# Patient Record
Sex: Female | Born: 1935 | Race: White | Hispanic: No | Marital: Married
Health system: Southern US, Community
[De-identification: ages and names within clinical notes are randomized; demographics above are authoritative.]

---

## 2003-05-31 ENCOUNTER — Other Ambulatory Visit: Payer: Self-pay

## 2003-11-29 ENCOUNTER — Encounter: Payer: Self-pay | Admitting: Internal Medicine

## 2003-12-30 ENCOUNTER — Encounter: Payer: Self-pay | Admitting: Internal Medicine

## 2004-01-29 ENCOUNTER — Encounter: Payer: Self-pay | Admitting: Internal Medicine

## 2004-02-29 ENCOUNTER — Encounter: Payer: Self-pay | Admitting: Internal Medicine

## 2004-03-31 ENCOUNTER — Encounter: Payer: Self-pay | Admitting: Internal Medicine

## 2004-04-28 ENCOUNTER — Encounter: Payer: Self-pay | Admitting: Internal Medicine

## 2004-05-29 ENCOUNTER — Encounter: Payer: Self-pay | Admitting: Internal Medicine

## 2004-06-28 ENCOUNTER — Encounter: Payer: Self-pay | Admitting: Internal Medicine

## 2004-07-29 ENCOUNTER — Encounter: Payer: Self-pay | Admitting: Internal Medicine

## 2004-08-28 ENCOUNTER — Encounter: Payer: Self-pay | Admitting: Internal Medicine

## 2004-09-28 ENCOUNTER — Encounter: Payer: Self-pay | Admitting: Internal Medicine

## 2004-10-29 ENCOUNTER — Encounter: Payer: Self-pay | Admitting: Internal Medicine

## 2004-11-28 ENCOUNTER — Encounter: Payer: Self-pay | Admitting: Internal Medicine

## 2004-12-29 ENCOUNTER — Encounter: Payer: Self-pay | Admitting: Internal Medicine

## 2005-01-28 ENCOUNTER — Encounter: Payer: Self-pay | Admitting: Internal Medicine

## 2005-02-28 ENCOUNTER — Encounter: Payer: Self-pay | Admitting: Internal Medicine

## 2005-03-31 ENCOUNTER — Encounter: Payer: Self-pay | Admitting: Internal Medicine

## 2005-04-28 ENCOUNTER — Encounter: Payer: Self-pay | Admitting: Internal Medicine

## 2005-05-29 ENCOUNTER — Encounter: Payer: Self-pay | Admitting: Internal Medicine

## 2005-06-28 ENCOUNTER — Encounter: Payer: Self-pay | Admitting: Internal Medicine

## 2005-07-29 ENCOUNTER — Encounter: Payer: Self-pay | Admitting: Internal Medicine

## 2005-08-28 ENCOUNTER — Encounter: Payer: Self-pay | Admitting: Internal Medicine

## 2005-09-28 ENCOUNTER — Encounter: Payer: Self-pay | Admitting: Internal Medicine

## 2005-10-29 ENCOUNTER — Encounter: Payer: Self-pay | Admitting: Internal Medicine

## 2005-11-28 ENCOUNTER — Encounter: Payer: Self-pay | Admitting: Internal Medicine

## 2005-12-29 ENCOUNTER — Encounter: Payer: Self-pay | Admitting: Internal Medicine

## 2006-01-28 ENCOUNTER — Encounter: Payer: Self-pay | Admitting: Internal Medicine

## 2006-02-28 ENCOUNTER — Encounter: Payer: Self-pay | Admitting: Internal Medicine

## 2006-03-31 ENCOUNTER — Encounter: Payer: Self-pay | Admitting: Internal Medicine

## 2006-04-29 ENCOUNTER — Encounter: Payer: Self-pay | Admitting: Internal Medicine

## 2006-05-30 ENCOUNTER — Encounter: Payer: Self-pay | Admitting: Internal Medicine

## 2006-06-29 ENCOUNTER — Encounter: Payer: Self-pay | Admitting: Internal Medicine

## 2006-07-30 ENCOUNTER — Encounter: Payer: Self-pay | Admitting: Internal Medicine

## 2006-08-30 ENCOUNTER — Encounter: Payer: Self-pay | Admitting: Internal Medicine

## 2006-09-13 ENCOUNTER — Ambulatory Visit: Payer: Self-pay | Admitting: Internal Medicine

## 2006-09-29 ENCOUNTER — Encounter: Payer: Self-pay | Admitting: Internal Medicine

## 2006-10-30 ENCOUNTER — Encounter: Payer: Self-pay | Admitting: Internal Medicine

## 2007-09-18 ENCOUNTER — Ambulatory Visit: Payer: Self-pay | Admitting: Internal Medicine

## 2008-09-02 ENCOUNTER — Ambulatory Visit: Payer: Self-pay | Admitting: Internal Medicine

## 2009-01-09 ENCOUNTER — Ambulatory Visit: Payer: Self-pay | Admitting: Internal Medicine

## 2009-04-10 ENCOUNTER — Ambulatory Visit: Payer: Self-pay | Admitting: Internal Medicine

## 2009-09-04 ENCOUNTER — Emergency Department: Payer: Self-pay | Admitting: Emergency Medicine

## 2010-01-28 ENCOUNTER — Ambulatory Visit: Payer: Self-pay | Admitting: Internal Medicine

## 2010-08-02 ENCOUNTER — Emergency Department: Payer: Self-pay | Admitting: Emergency Medicine

## 2010-09-16 ENCOUNTER — Ambulatory Visit: Payer: Self-pay | Admitting: Internal Medicine

## 2011-08-16 ENCOUNTER — Emergency Department: Payer: Self-pay | Admitting: Internal Medicine

## 2011-08-16 LAB — COMPREHENSIVE METABOLIC PANEL
Albumin: 3.7 g/dL (ref 3.4–5.0)
Alkaline Phosphatase: 93 U/L (ref 50–136)
Anion Gap: 9 (ref 7–16)
BUN: 22 mg/dL — ABNORMAL HIGH (ref 7–18)
Bilirubin,Total: 1.5 mg/dL — ABNORMAL HIGH (ref 0.2–1.0)
Co2: 26 mmol/L (ref 21–32)
Creatinine: 1.39 mg/dL — ABNORMAL HIGH (ref 0.60–1.30)
EGFR (African American): 42 — ABNORMAL LOW
EGFR (Non-African Amer.): 36 — ABNORMAL LOW
Potassium: 3.6 mmol/L (ref 3.5–5.1)
SGOT(AST): 25 U/L (ref 15–37)
Sodium: 143 mmol/L (ref 136–145)
Total Protein: 8.2 g/dL (ref 6.4–8.2)

## 2011-08-16 LAB — CBC
HCT: 35.3 % (ref 35.0–47.0)
HGB: 11.5 g/dL — ABNORMAL LOW (ref 12.0–16.0)
MCH: 27.5 pg (ref 26.0–34.0)
MCV: 85 fL (ref 80–100)
RBC: 4.16 10*6/uL (ref 3.80–5.20)
RDW: 18.1 % — ABNORMAL HIGH (ref 11.5–14.5)

## 2011-08-16 LAB — URINALYSIS, COMPLETE
Bacteria: NONE SEEN
Bilirubin,UR: NEGATIVE
Leukocyte Esterase: NEGATIVE
Ph: 5 (ref 4.5–8.0)
RBC,UR: 9 /HPF (ref 0–5)
Squamous Epithelial: NONE SEEN
WBC UR: 2 /HPF (ref 0–5)

## 2011-08-16 LAB — PROTIME-INR: Prothrombin Time: 23.7 secs — ABNORMAL HIGH (ref 11.5–14.7)

## 2012-01-30 ENCOUNTER — Ambulatory Visit: Payer: Self-pay

## 2012-05-16 ENCOUNTER — Other Ambulatory Visit: Payer: Self-pay | Admitting: Physician Assistant

## 2012-05-21 LAB — CULTURE, BLOOD (SINGLE)

## 2012-12-23 ENCOUNTER — Emergency Department: Payer: Self-pay | Admitting: Emergency Medicine

## 2012-12-23 LAB — URINALYSIS, COMPLETE: Ph: 5 (ref 4.5–8.0)

## 2012-12-23 LAB — COMPREHENSIVE METABOLIC PANEL
Albumin: 3.5 g/dL (ref 3.4–5.0)
Alkaline Phosphatase: 82 U/L (ref 50–136)
Anion Gap: 5 — ABNORMAL LOW (ref 7–16)
BUN: 23 mg/dL — ABNORMAL HIGH (ref 7–18)
Bilirubin,Total: 1 mg/dL (ref 0.2–1.0)
Calcium, Total: 9.4 mg/dL (ref 8.5–10.1)
Chloride: 108 mmol/L — ABNORMAL HIGH (ref 98–107)
Co2: 31 mmol/L (ref 21–32)
Creatinine: 1.57 mg/dL — ABNORMAL HIGH (ref 0.60–1.30)
EGFR (African American): 36 — ABNORMAL LOW
EGFR (Non-African Amer.): 31 — ABNORMAL LOW
Glucose: 90 mg/dL (ref 65–99)
Osmolality: 290 (ref 275–301)
Potassium: 4.4 mmol/L (ref 3.5–5.1)
SGOT(AST): 29 U/L (ref 15–37)
SGPT (ALT): 16 U/L (ref 12–78)
Sodium: 144 mmol/L (ref 136–145)
Total Protein: 8 g/dL (ref 6.4–8.2)

## 2012-12-23 LAB — PROTIME-INR: Prothrombin Time: 24.3 secs — ABNORMAL HIGH (ref 11.5–14.7)

## 2012-12-23 LAB — CBC
HGB: 11.7 g/dL — ABNORMAL LOW (ref 12.0–16.0)
MCH: 27.9 pg (ref 26.0–34.0)
MCHC: 33.6 g/dL (ref 32.0–36.0)
MCV: 83 fL (ref 80–100)
RDW: 18.2 % — ABNORMAL HIGH (ref 11.5–14.5)

## 2012-12-23 LAB — APTT: Activated PTT: 35.5 secs (ref 23.6–35.9)

## 2012-12-25 LAB — URINE CULTURE

## 2012-12-27 IMAGING — CT CT ABD-PELV W/ CM
1 of 4 series · 11 of 32 positions shown, 17 images · non-contrast
Comparison: none

REASON FOR EXAM: Eval cirrhosis kidney and spleen lesions
COMMENTS:

[Series 2: soft tissue · axial · 0.70mm/px · z∈[-287,+70]mm · 11 of 143 slices shown, 17 images]
[im 12/143  soft-tissue]
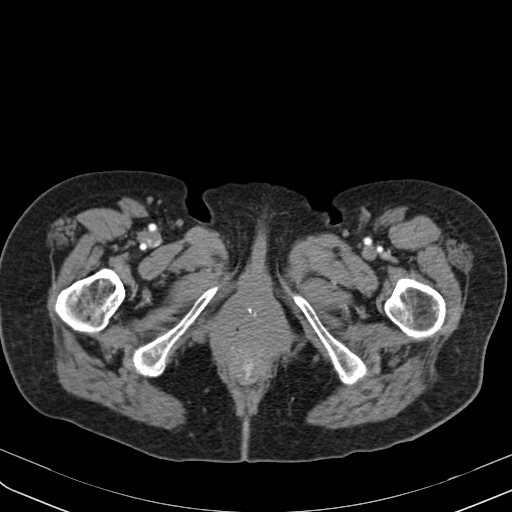
[im 12/143  bone]
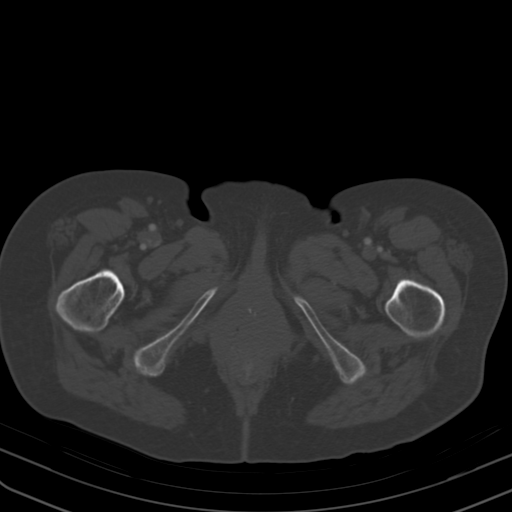
[im 24/143  soft-tissue]
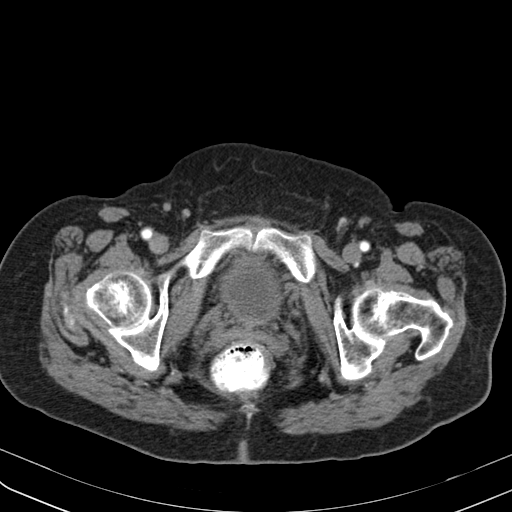
[im 36/143  soft-tissue]
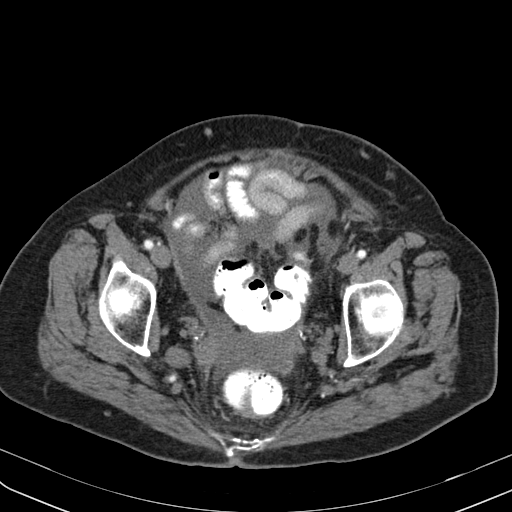
[im 48/143  soft-tissue]
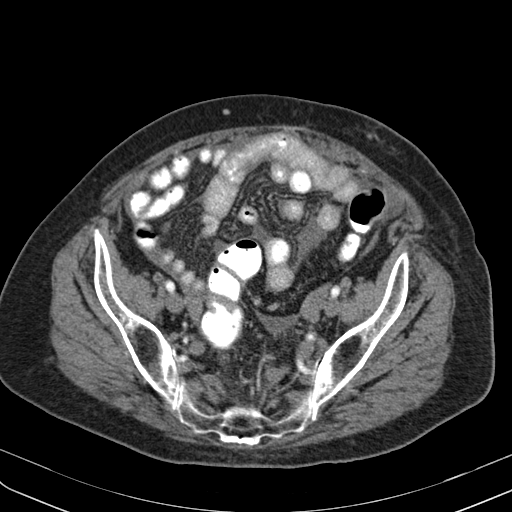
[im 60/143  soft-tissue]
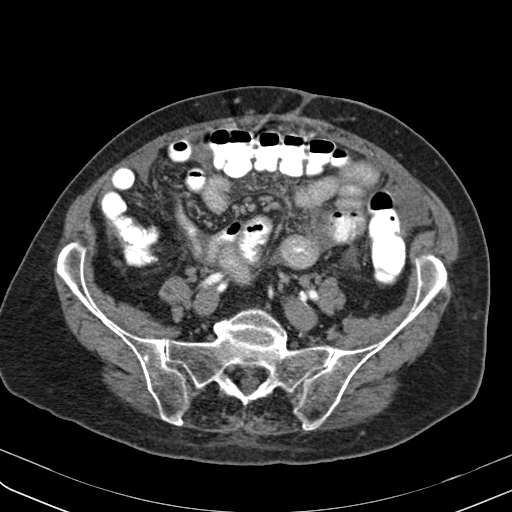
[im 72/143  soft-tissue]
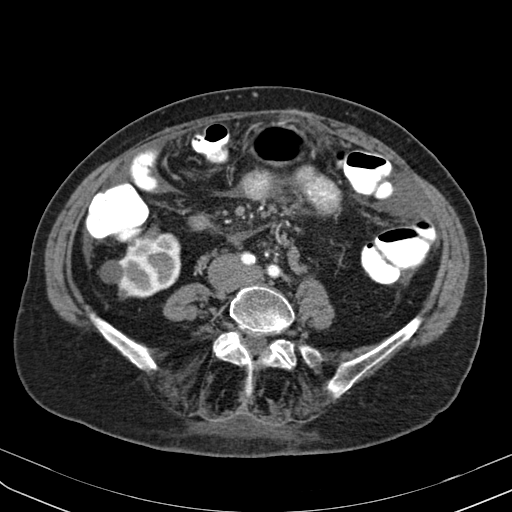
[im 83/143  soft-tissue]
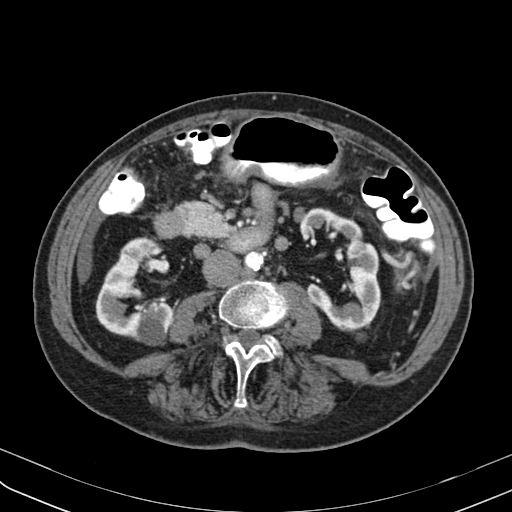
[im 95/143  soft-tissue]
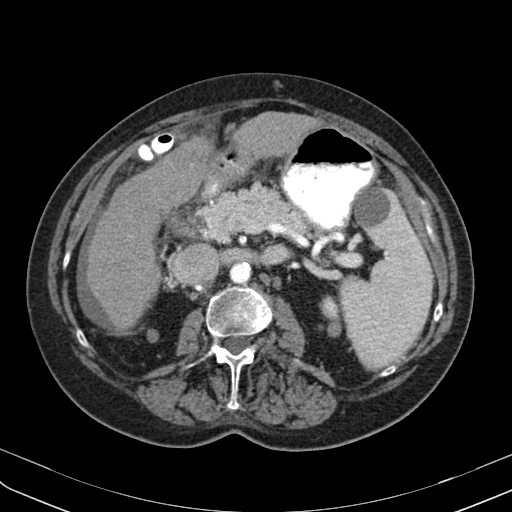
[im 95/143  lung]
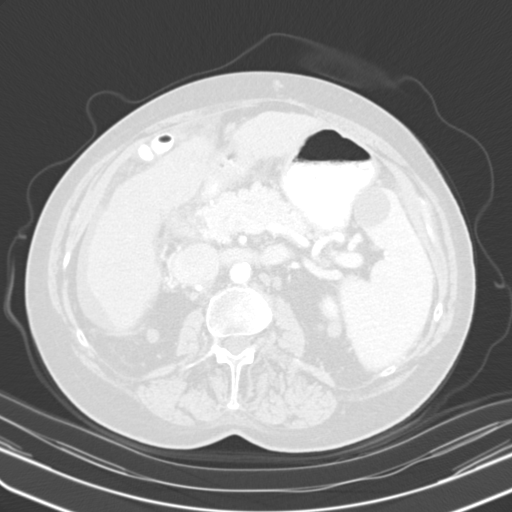
[im 107/143  soft-tissue]
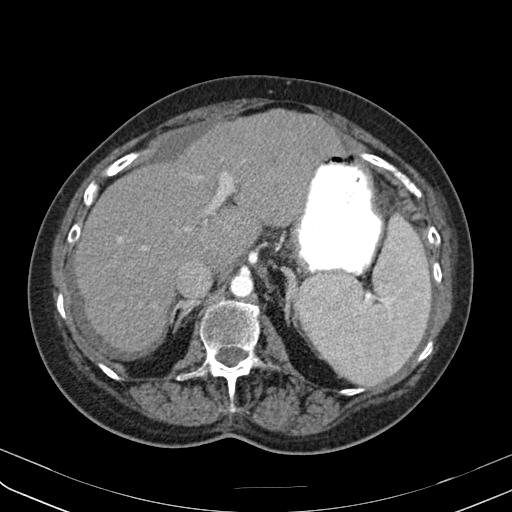
[im 107/143  lung]
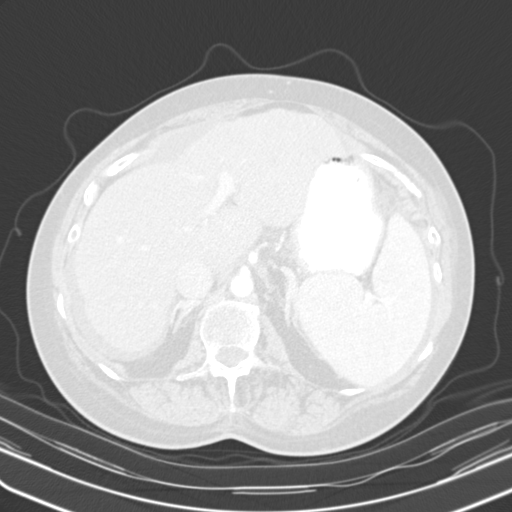
[im 107/143  bone]
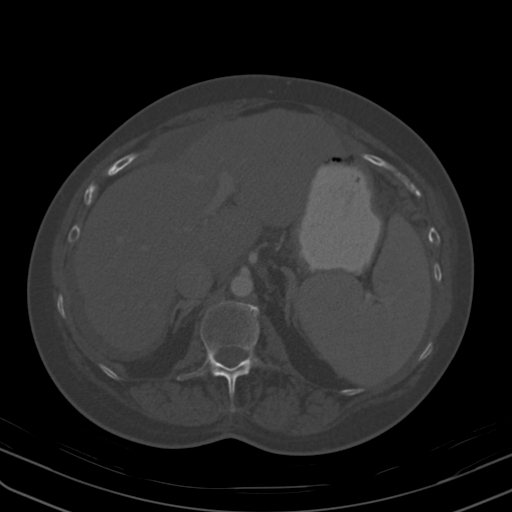
[im 119/143  soft-tissue]
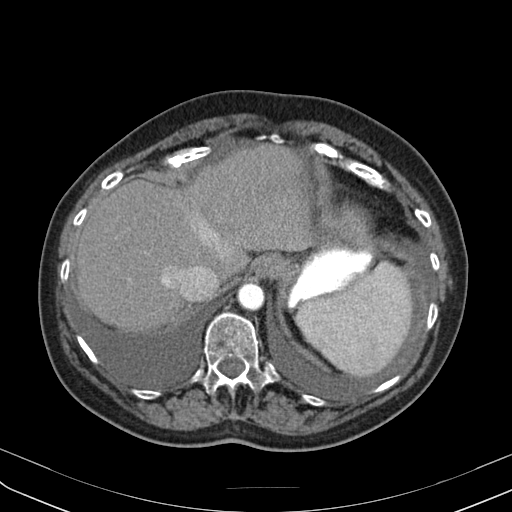
[im 119/143  lung]
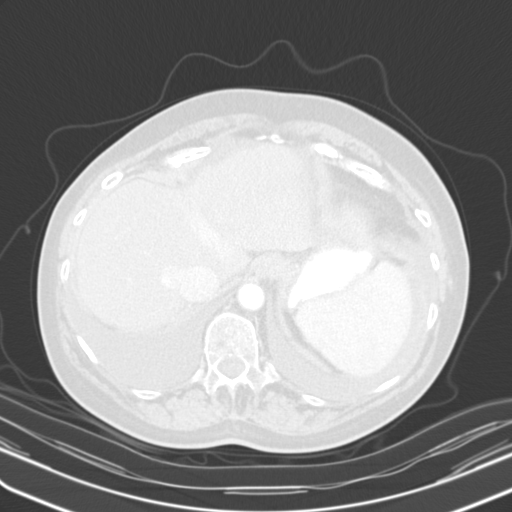
[im 131/143  soft-tissue]
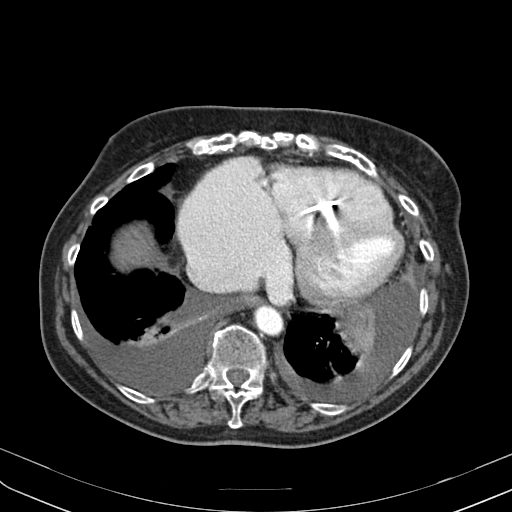
[im 131/143  lung]
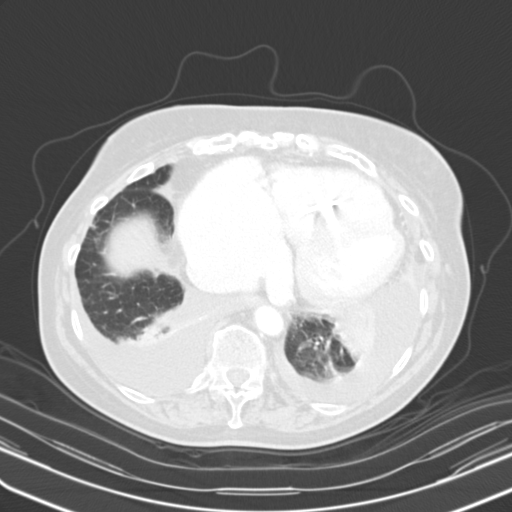

[11 of 32 positions shown; findings below may reference images not displayed]

PROCEDURE:     BABICZ - BABICZ ABDOMEN / PELVIS W  - January 30, 2012 [DATE]

RESULT:     History: Colon cancer. Cirrhosis. Splenic and renal lesions.

Comparison Study: Prior CT of 08/16/2011. Findings: Change again noted
consistent with cirrhosis with portal hypertension. Splenomegaly. Mild
ascites present. Prominent pleural effusions are present. Present of a
splenic lesions appear to represent cysts. Multiple bilateral renal lesions
are again noted and are most likely cysts. These are stable. Abdominal aorta
normal in caliber. No evidence of bowel distention. Ascites present.
Cardiomegaly with cardiac pacer.
IMPRESSION: 1. Cirrhosis with portal hypertension. Ascites. Prominent bilateral pleural
effusions.
2. Severe cardiomegaly.
3. Splenic and renal lesions are stable and most likely cysts.

## 2013-05-13 ENCOUNTER — Ambulatory Visit: Payer: Self-pay | Admitting: Ophthalmology

## 2013-05-13 LAB — HEMOGLOBIN: HGB: 13.3 g/dL (ref 12.0–16.0)

## 2013-05-13 LAB — PROTIME-INR
INR: 3.4
Prothrombin Time: 33.2 secs — ABNORMAL HIGH (ref 11.5–14.7)

## 2013-05-28 ENCOUNTER — Ambulatory Visit: Payer: Self-pay | Admitting: Ophthalmology

## 2013-08-10 ENCOUNTER — Emergency Department: Payer: Self-pay | Admitting: Emergency Medicine

## 2013-08-10 LAB — BASIC METABOLIC PANEL
Anion Gap: 4 — ABNORMAL LOW (ref 7–16)
BUN: 21 mg/dL — ABNORMAL HIGH (ref 7–18)
CALCIUM: 9.4 mg/dL (ref 8.5–10.1)
CREATININE: 1.54 mg/dL — AB (ref 0.60–1.30)
Chloride: 106 mmol/L (ref 98–107)
Co2: 31 mmol/L (ref 21–32)
EGFR (Non-African Amer.): 32 — ABNORMAL LOW
GFR CALC AF AMER: 37 — AB
GLUCOSE: 142 mg/dL — AB (ref 65–99)
OSMOLALITY: 287 (ref 275–301)
POTASSIUM: 4.2 mmol/L (ref 3.5–5.1)
Sodium: 141 mmol/L (ref 136–145)

## 2013-08-10 LAB — CBC
HCT: 36.7 % (ref 35.0–47.0)
HGB: 11.8 g/dL — ABNORMAL LOW (ref 12.0–16.0)
MCH: 27.5 pg (ref 26.0–34.0)
MCHC: 32.1 g/dL (ref 32.0–36.0)
MCV: 86 fL (ref 80–100)
Platelet: 102 10*3/uL — ABNORMAL LOW (ref 150–440)
RBC: 4.28 10*6/uL (ref 3.80–5.20)
RDW: 16.7 % — ABNORMAL HIGH (ref 11.5–14.5)
WBC: 4.3 10*3/uL (ref 3.6–11.0)

## 2013-08-10 LAB — URINALYSIS, COMPLETE
RBC,UR: 12000 /HPF (ref 0–5)
SPECIFIC GRAVITY: 1.01 (ref 1.003–1.030)
SQUAMOUS EPITHELIAL: NONE SEEN

## 2013-08-10 LAB — PROTIME-INR
INR: 2.5
Prothrombin Time: 26.1 secs — ABNORMAL HIGH (ref 11.5–14.7)

## 2013-08-12 LAB — URINE CULTURE

## 2013-10-29 DEATH — deceased

## 2014-06-21 NOTE — Op Note (Signed)
PATIENT NAME:  Eber JonesRAINES, Corianna R MR#:  [id number] DATE OF BIRTH:  12-28-1930  DATE OF PROCEDURE:  05/28/2013  PREOPERATIVE DIAGNOSIS: Visually significant cataract of the right eye.   POSTOPERATIVE DIAGNOSIS: Visually significant cataract of the right eye.   PROCEDURE PERFORMED: Cataract extraction by phacoemulsification with implant of intraocular lens to the right eye.   SURGEON: Galen ManilaWilliam Rosalynn Sergent, MD   ANESTHESIA: General endotracheal anesthesia.   COMPLICATIONS: None.   PROCEDURE IN DETAIL: The patient was examined, consented for this procedure in the preoperative holding area, and then brought back to the Operating Room, where the Anesthesia team employed general endotracheal anesthesia.  The right eye was prepped and draped in the usual sterile ophthalmic fashion.   A side-port blade was used to create a paracentesis, and the anterior chamber was filled with DisCoVisc. A near-clear corneal incision was created, followed by a continuous curvilinear capsulorrhexis using the cystotome and capsulorrhexis forceps. Hydrodissection and hydrodelineation were carried out with BSS on a blunt cannula. The lens was removed in a four quadrant stop and chop technique. The remaining cortical material was removed with the irrigation-aspiration handpiece. The capsular bag was inflated with the viscoelastic, and the Tecnis ZCB00 23.5-diopter lens, serial number [id number], was placed in the left eye. The remaining viscoelastic was removed from the eye. 0.1 mL of cefuroxime concentration 10 mg/mL was placed in the anterior chamber. The wounds were hydrated and found to be watertight. The eye was inflated to a physiologic pressure.   The patient was awakened from general anesthesia without complication, and instructed to begin her regimen of drops that afternoon, and follow up with me in one day.   ____________________________ Jerilee FieldWilliam L. Allysen Lazo, MD wlp:gb D: 05/28/2013 21:30:00 ET T: 05/28/2013 23:33:18  ET JOB#: [id number]  cc: Vergia Chea L. Kayna Suppa, MD, <Dictator> Jerilee FieldWILLIAM L Travell Desaulniers MD ELECTRONICALLY SIGNED 05/29/2013 8:58
